# Patient Record
Sex: Male | Born: 2000 | Race: Black or African American | Hispanic: No | Marital: Single | Smoking: Never smoker
Health system: Southern US, Community
[De-identification: ages and names within clinical notes are randomized; demographics above are authoritative.]

---
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
## 2022-02-05 ENCOUNTER — Telehealth: Payer: Self-pay | Admitting: General Practice

## 2022-02-05 NOTE — Telephone Encounter (Signed)
Transition Care Management Follow-up Telephone Call Date of discharge and from where: 02/02/22 from [facility] How have you been since you were released from the hospital? His mom reports he is doing better; needs to be seen within 1-3 days. Any questions or concerns? No  Items Reviewed: Did the pt receive and understand the discharge instructions provided? Yes  Medications obtained and verified? Yes  Other? No  Any new allergies since your discharge? No  Dietary orders reviewed? Yes Do you have support at home? Yes   Home Care and Equipment/Supplies: Were home health services ordered? no  Functional Questionnaire: (I = Independent and D = Dependent) ADLs: I  Bathing/Dressing- I  Meal Prep- I  Eating- I  Maintaining continence- I  Transferring/Ambulation- I  Managing Meds- I  Follow up appointments reviewed:  PCP Hospital f/u appt confirmed? Yes  Scheduled to see Dr. Mel Almond on 02/07/22 @ 1400pm. [facility] f/u appt confirmed? No   Are transportation arrangements needed? No  If their condition worsens, is the pt aware to call PCP or go to the Emergency Dept.? Yes Was the patient provided with contact information for the PCP's office or ED? Yes Was to pt encouraged to call back with questions or concerns? Yes

## 2022-02-07 ENCOUNTER — Encounter: Payer: Self-pay | Admitting: Family Medicine

## 2022-02-07 ENCOUNTER — Ambulatory Visit (INDEPENDENT_AMBULATORY_CARE_PROVIDER_SITE_OTHER): Payer: Managed Care, Other (non HMO) | Admitting: Family Medicine

## 2022-02-07 VITALS — BP 115/70 | HR 75 | Ht 69.25 in | Wt 153.0 lb

## 2022-02-07 DIAGNOSIS — E876 Hypokalemia: Secondary | ICD-10-CM | POA: Diagnosis not present

## 2022-02-07 DIAGNOSIS — Z09 Encounter for follow-up examination after completed treatment for conditions other than malignant neoplasm: Secondary | ICD-10-CM

## 2022-02-07 NOTE — Assessment & Plan Note (Signed)
-   repeat CMP level to follow up on K  - encouraged K rich foods

## 2022-02-07 NOTE — Assessment & Plan Note (Signed)
-   believe syncopal episode is likely related to stress and lack of sleep  - encouraged sleeping and at least getting 6-8 hours of sleep on the weekends.

## 2022-02-07 NOTE — Progress Notes (Signed)
   Established Patient Office Visit  Subjective   Patient ID: Kristopher Green, male    DOB: May 28, 2000  Age: 21 y.o. MRN: [id number]  Chief Complaint  Patient presents with   Hospitalization Follow-up    HPI Pt presents for hospital follow up   He was recently seen at [facility] ED for a syncopal episode. Labs were significant for hypokalemia. All other labs were normal and pt was discharged home.   Today, pt is doing well. He says he felt fine during his ED visit. He is under a lot of stress. 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   Review of Systems  Constitutional:  Negative for chills and fever.  Respiratory:  Negative for cough and shortness of breath.   Cardiovascular:  Negative for chest pain.  Neurological:  Negative for headaches.      Objective:     BP 115/70   Pulse 75   Ht 5' 9.25" (1.759 m)   Wt 153 lb (69.4 kg)   SpO2 99%   BMI 22.43 kg/m    Physical Exam Vitals and nursing note reviewed.  Constitutional:      General: He is not in acute distress.    Appearance: Normal appearance.  HENT:     Head: Normocephalic and atraumatic.     Right Ear: External ear normal.     Left Ear: External ear normal.     Nose: Nose normal.  Eyes:     Conjunctiva/sclera: Conjunctivae normal.  Cardiovascular:     Rate and Rhythm: Normal rate and regular rhythm.  Pulmonary:     Effort: Pulmonary effort is normal.     Breath sounds: Normal breath sounds.  Neurological:     General: No focal deficit present.     Mental Status: He is alert and oriented to person, place, and time.  Psychiatric:        Mood and Affect: Mood normal.        Behavior: Behavior normal.        Thought Content: Thought content normal.        Judgment: Judgment normal.    No results found for any visits on 02/07/22.  The ASCVD Risk score (Arnett DK, et al., 2019) failed to calculate for the following  reasons:   The 2019 ASCVD risk score is only valid for ages 28 to 4    Assessment & Plan:   Problem List Items Addressed This Visit       Other   Hypokalemia    - repeat CMP level to follow up on K  - encouraged K rich foods      Relevant Orders   [facility] discharge follow-up - Primary    - believe syncopal episode is likely related to stress and lack of sleep  - encouraged sleeping and at least getting 6-8 hours of sleep on the weekends.       Return if symptoms worsen or fail to improve.    Owens Loffler, DO

## 2022-02-08 LAB — COMPLETE METABOLIC PANEL WITH GFR
AG Ratio: 1.6 (calc) (ref 1.0–2.5)
ALT: 6 U/L — ABNORMAL LOW (ref 8–46)
AST: 10 U/L — ABNORMAL LOW (ref 12–32)
Albumin: 4.9 g/dL (ref 3.6–5.1)
Alkaline phosphatase (APISO): 56 U/L (ref 46–169)
BUN: 11 mg/dL (ref 7–20)
CO2: 30 mmol/L (ref 20–32)
Calcium: 9.7 mg/dL (ref 8.9–10.4)
Chloride: 103 mmol/L (ref 98–110)
Creat: 1.05 mg/dL (ref 0.60–1.24)
Globulin: 3.1 g/dL (calc) (ref 2.1–3.5)
Glucose, Bld: 81 mg/dL (ref 65–99)
Potassium: 3.8 mmol/L (ref 3.8–5.1)
Sodium: 139 mmol/L (ref 135–146)
Total Bilirubin: 0.6 mg/dL (ref 0.2–1.1)
Total Protein: 8 g/dL (ref 6.3–8.2)
eGFR: 105 mL/min/{1.73_m2} (ref >=60)

## 2022-09-04 ENCOUNTER — Encounter: Payer: Self-pay | Admitting: *Deleted

## 2022-09-10 ENCOUNTER — Telehealth: Payer: Self-pay | Admitting: General Practice

## 2022-09-10 NOTE — Transitions of Care (Post Inpatient/ED Visit) (Signed)
   09/10/2022  Name: Kristopher Green MRN: [id number] DOB: 14-Sep-2000  Today's TOC FU Call Status: Today's TOC FU Call Status:: Unsuccessul Call (1st Attempt) Unsuccessful Call (1st Attempt) Date: 09/10/22  Attempted to reach the patient regarding the most recent Inpatient/ED visit.  Follow Up Plan: Additional outreach attempts will be made to reach the patient to complete the Transitions of Care (Post Inpatient/ED visit) call.   Signature Modesto Charon, RN BSN

## 2022-09-11 NOTE — Transitions of Care (Post Inpatient/ED Visit) (Signed)
   09/11/2022  Name: Kristopher Green MRN: [id number] DOB: February 12, 2001  Today's TOC FU Call Status: Today's TOC FU Call Status:: Unsuccessful Call (2nd Attempt) Unsuccessful Call (1st Attempt) Date: 09/10/22 Unsuccessful Call (2nd Attempt) Date: 09/11/22  Attempted to reach the patient regarding the most recent Inpatient/ED visit.  Follow Up Plan: Additional outreach attempts will be made to reach the patient to complete the Transitions of Care (Post Inpatient/ED visit) call.   Signature Modesto Charon, RN BSN

## 2022-09-12 NOTE — Transitions of Care (Post Inpatient/ED Visit) (Signed)
   09/12/2022  Name: Kristopher Green MRN: [id number] DOB: Apr 22, 2001  Today's TOC FU Call Status: Today's TOC FU Call Status:: Unsuccessful Call (3rd Attempt) Unsuccessful Call (1st Attempt) Date: 09/10/22 Unsuccessful Call (2nd Attempt) Date: 09/11/22 Unsuccessful Call (3rd Attempt) Date: 09/12/22  Attempted to reach the patient regarding the most recent Inpatient/ED visit.  Follow Up Plan: No further outreach attempts will be made at this time. We have been unable to contact the patient.  Signature Modesto Charon, RN BSN

## 2022-10-31 ENCOUNTER — Telehealth: Payer: Self-pay

## 2022-10-31 NOTE — Telephone Encounter (Signed)
LVM for patient to call back [phone], or to call PCP office to schedule follow up apt. AS, CMA  

## 2023-09-21 ENCOUNTER — Encounter (HOSPITAL_COMMUNITY): Payer: Self-pay

## 2023-09-21 ENCOUNTER — Emergency Department (HOSPITAL_COMMUNITY)
Admission: EM | Admit: 2023-09-21 | Discharge: 2023-09-22 | Disposition: A | Discharge status: Home / Self Care | Attending: Emergency Medicine | Admitting: Emergency Medicine

## 2023-09-21 ENCOUNTER — Other Ambulatory Visit: Payer: Self-pay

## 2023-09-21 ENCOUNTER — Emergency Department (HOSPITAL_COMMUNITY)

## 2023-09-21 DIAGNOSIS — J069 Acute upper respiratory infection, unspecified: Secondary | ICD-10-CM | POA: Insufficient documentation

## 2023-09-21 DIAGNOSIS — R059 Cough, unspecified: Secondary | ICD-10-CM | POA: Diagnosis present

## 2023-09-21 DIAGNOSIS — B9789 Other viral agents as the cause of diseases classified elsewhere: Secondary | ICD-10-CM | POA: Insufficient documentation

## 2023-09-21 LAB — CBC WITH DIFFERENTIAL/PLATELET
Abs Immature Granulocytes: 0.02 10*3/uL (ref 0.00–0.07)
Basophils Absolute: 0 10*3/uL (ref 0.0–0.1)
Basophils Relative: 0 %
Eosinophils Absolute: 0.2 10*3/uL (ref 0.0–0.5)
Eosinophils Relative: 1 %
HCT: 46.7 % (ref 39.0–52.0)
Hemoglobin: 15.8 g/dL (ref 13.0–17.0)
Immature Granulocytes: 0 %
Lymphocytes Relative: 16 %
Lymphs Abs: 1.9 10*3/uL (ref 0.7–4.0)
MCH: 29.9 pg (ref 26.0–34.0)
MCHC: 33.8 g/dL (ref 30.0–36.0)
MCV: 88.3 fL (ref 80.0–100.0)
Monocytes Absolute: 1.3 10*3/uL — ABNORMAL HIGH (ref 0.1–1.0)
Monocytes Relative: 11 %
Neutro Abs: 8.4 10*3/uL — ABNORMAL HIGH (ref 1.7–7.7)
Neutrophils Relative %: 72 %
Platelets: 177 10*3/uL (ref 150–400)
RBC: 5.29 MIL/uL (ref 4.22–5.81)
RDW: 13 % (ref 11.5–15.5)
WBC: 11.9 10*3/uL — ABNORMAL HIGH (ref 4.0–10.5)
nRBC: 0 % (ref 0.0–0.2)

## 2023-09-21 NOTE — ED Triage Notes (Signed)
 Productive cough with white thick secretions that started 3 days ago. Pt feels sob when he exerts himself that started yesterday. Pt states yesterday he felt a pop in his upper back yesterday

## 2023-09-21 NOTE — ED Notes (Signed)
 Patient transported to X-ray

## 2023-09-21 NOTE — ED Provider Notes (Signed)
 [place] EMERGENCY DEPARTMENT AT [facility] Provider Note   CSN: [id number] Arrival date & time: 09/21/23  2154     History {Add pertinent medical, surgical, social history, OB history to HPI:1} Chief Complaint  Patient presents with   Cough    Kristopher Green is a 23 y.o. male.  Patient here with difficulty breathing, cough for the past 3 days.  States his cough is restive of white mucus.  No fever.  Some right sided chest pain with coughing the last for several minutes to hours and resolves on its own.  No history of asthma.  Smokes marijuana but no cigarettes.  Pain is to his right chest that comes and goes lasting for several minutes to hours at a time.  Some shortness of breath.  Cough productive of white mucus with nasal congestion.  No sore throat.  No abdominal pain, vomiting, diarrhea or fever.  No pain with urination or blood in the urine.  Denies any history of asthma.  No cardiac or pulmonary history.  Has had sick contacts at home.  The history is provided by the patient.  Cough Associated symptoms: chest pain and rhinorrhea   Associated symptoms: no fever, no headaches, no myalgias and no rash        Home Medications Prior to Admission medications   Not on File      Allergies    Patient has no known allergies.    Review of Systems   Review of Systems  Constitutional:  Negative for activity change, appetite change and fever.  HENT:  Positive for congestion and rhinorrhea.   Respiratory:  Positive for cough and chest tightness.   Cardiovascular:  Positive for chest pain.  Gastrointestinal:  Negative for abdominal pain, nausea and vomiting.  Genitourinary:  Negative for dysuria and hematuria.  Musculoskeletal:  Negative for arthralgias and myalgias.  Skin:  Negative for rash.  Neurological:  Negative for dizziness, weakness and headaches.   all other systems are negative except as noted in the HPI and PMH.    Physical Exam Updated Vital  Signs BP 114/81   Pulse 94   Temp 98.4 F (36.9 C) (Oral)   Resp 18   Ht 5\' 9"  (1.753 m)   Wt 74.8 kg   SpO2 98%   BMI 24.37 kg/m  Physical Exam Vitals and nursing note reviewed.  Constitutional:      General: He is not in acute distress.    Appearance: He is well-developed.     Comments: No distress, speaking in full sentences  HENT:     Head: Normocephalic and atraumatic.     Mouth/Throat:     Pharynx: No oropharyngeal exudate.  Eyes:     Conjunctiva/sclera: Conjunctivae normal.     Pupils: Pupils are equal, round, and reactive to light.  Neck:     Comments: No meningismus. Cardiovascular:     Rate and Rhythm: Normal rate and regular rhythm.     Heart sounds: Normal heart sounds. No murmur heard. Pulmonary:     Effort: Pulmonary effort is normal. No respiratory distress.     Breath sounds: Normal breath sounds.  Chest:     Chest wall: Tenderness present.  Abdominal:     Palpations: Abdomen is soft.     Tenderness: There is no abdominal tenderness. There is no guarding or rebound.  Musculoskeletal:        General: No tenderness. Normal range of motion.     Cervical back: Normal range of  motion and neck supple.  Skin:    General: Skin is warm.  Neurological:     Mental Status: He is alert and oriented to person, place, and time.     Cranial Nerves: No cranial nerve deficit.     Motor: No abnormal muscle tone.     Coordination: Coordination normal.     Comments:  5/5 strength throughout. CN 2-12 intact.Equal grip strength.   Psychiatric:        Behavior: Behavior normal.     ED Results / Procedures / Treatments   Labs (all labs ordered are listed, but only abnormal results are displayed) Labs Reviewed  RESP PANEL BY RT-PCR (RSV, FLU A&B, COVID)  [id number]  CBC WITH DIFFERENTIAL/PLATELET  COMPREHENSIVE METABOLIC PANEL WITH GFR  D-DIMER, QUANTITATIVE  TROPONIN I (HIGH SENSITIVITY)    EKG EKG Interpretation Date/Time:  Saturday Sep 21 2023 22:07:19  EDT Ventricular Rate:  101 PR Interval:  172 QRS Duration:  85 QT Interval:  296 QTC Calculation: 384 R Axis:   93  Text Interpretation: Sinus tachycardia Right atrial enlargement Borderline right axis deviation ST elev, probable normal early repol pattern No previous ECGs available Confirmed by Earma Gloss [phone]) on 09/21/2023 11:04:28 PM  Radiology No results found.  Procedures Procedures  {Document cardiac monitor, telemetry assessment procedure when appropriate:1}  Medications Ordered in ED Medications - No data to display  ED Course/ Medical Decision Making/ A&P   {   Click here for ABCD2, HEART and other calculatorsREFRESH Note before signing :1}                              Medical Decision Making Amount and/or Complexity of Data Reviewed Labs: ordered. Decision-making details documented in ED Course. Radiology: ordered and independent interpretation performed. Decision-making details documented in ED Course. ECG/medicine tests: ordered and independent interpretation performed. Decision-making details documented in ED Course.   3 days of URI symptoms with cough, shortness of breath, chest pain with coughing.  No hypoxia or increased work of breathing.  EKG is nonischemic.  Lungs are clear without wheezing or rhonchi.  Will check labs and chest x-ray and COVID swab Low suspicion for ACS or PE  {Document critical care time when appropriate:1} {Document review of labs and clinical decision tools ie heart score, Chads2Vasc2 etc:1}  {Document your independent review of radiology images, and any outside records:1} {Document your discussion with family members, caretakers, and with consultants:1} {Document social determinants of health affecting pt's care:1} {Document your decision making why or why not admission, treatments were needed:1} Final Clinical Impression(s) / ED Diagnoses Final diagnoses:  None    Rx / DC Orders ED Discharge Orders     None

## 2023-09-22 LAB — COMPREHENSIVE METABOLIC PANEL WITH GFR
ALT: 14 U/L (ref 0–44)
AST: 16 U/L (ref 15–41)
Albumin: 4.5 g/dL (ref 3.5–5.0)
Alkaline Phosphatase: 52 U/L (ref 38–126)
Anion gap: 10 (ref 5–15)
BUN: 15 mg/dL (ref 6–20)
CO2: 27 mmol/L (ref 22–32)
Calcium: 9.9 mg/dL (ref 8.9–10.3)
Chloride: 103 mmol/L (ref 98–111)
Creatinine, Ser: 1.12 mg/dL (ref 0.61–1.24)
GFR, Estimated: >60 mL/min (ref >60)
Glucose, Bld: 81 mg/dL (ref 70–99)
Potassium: 4.2 mmol/L (ref 3.5–5.1)
Sodium: 140 mmol/L (ref 135–145)
Total Bilirubin: 1.3 mg/dL — ABNORMAL HIGH (ref 0.0–1.2)
Total Protein: 8 g/dL (ref 6.5–8.1)

## 2023-09-22 LAB — RESP PANEL BY RT-PCR (RSV, FLU A&B, COVID)  RVPGX2
Influenza A by PCR: NEGATIVE
Influenza B by PCR: NEGATIVE
Resp Syncytial Virus by PCR: NEGATIVE
SARS Coronavirus 2 by RT PCR: NEGATIVE

## 2023-09-22 LAB — TROPONIN I (HIGH SENSITIVITY): Troponin I (High Sensitivity): <2 ng/L (ref <18)

## 2023-09-22 LAB — D-DIMER, QUANTITATIVE: D-Dimer, Quant: <0.27 ug{FEU}/mL (ref 0.00–0.50)

## 2023-09-22 MED ORDER — ALBUTEROL SULFATE HFA 108 (90 BASE) MCG/ACT IN AERS: 1.0000 | INHALATION_SPRAY | Freq: Four times a day (QID) | RESPIRATORY_TRACT | 0 refills | Status: AC | PRN

## 2023-09-22 MED ORDER — DOXYCYCLINE HYCLATE 100 MG PO CAPS: 100.0000 mg | ORAL_CAPSULE | Freq: Two times a day (BID) | ORAL | 0 refills | Status: DC

## 2023-09-22 NOTE — Discharge Instructions (Signed)
 Testing is reassuring.  No evidence of pneumonia.  Take the antibiotic as prescribed for possible bronchitis.  Keep yourself hydrated.  Use Tylenol  or Motrin as needed for aches and for fevers.  Return to the ED with chest pain, exertional shortness of breath or other concerns

## 2024-04-12 ENCOUNTER — Encounter: Payer: Self-pay | Admitting: Emergency Medicine

## 2024-04-12 ENCOUNTER — Ambulatory Visit
Admission: EM | Admit: 2024-04-12 | Discharge: 2024-04-12 | Disposition: A | Discharge status: Home / Self Care | Payer: Worker's Compensation | Attending: Family Medicine | Admitting: Family Medicine

## 2024-04-12 DIAGNOSIS — M545 Low back pain, unspecified: Secondary | ICD-10-CM

## 2024-04-12 NOTE — Discharge Instructions (Signed)
 May take ibuprofen 600 mg if needed for back pain Try to limit twisting Report to work tomorrow.  If you are unable to perform your duties I will send you for Worker's Comp. evaluation

## 2024-04-12 NOTE — ED Triage Notes (Signed)
 Patient presents to Urgent Care with complaints of lower back injury at work since 11:30 am yesterday. Patient reports moving a pallet at work and twisted his back and felt a pull/tension of the lower left back. Sitting is no pain, standing and twisting does cause pain. Applied heating pad to the area. No pain medications needed

## 2024-04-12 NOTE — ED Provider Notes (Signed)
 [facility]    CSN: [id number] Arrival date & time: 04/12/24  1211      History   Chief Complaint No chief complaint on file.   HPI Kristopher Green is a 23 y.o. male.   HPI  Patient states he pulled a muscle in his back at work day before yesterday.  He states he was bent over and was pulling on a jack that was stuck and as he twisted he felt some pain.  He did report this.  He was unable to finish his shift.  He has been home recovering today.  He would like a note to go back to work tomorrow.  He has not taken any Advil or pain medication.  Pain only is present with certain twisting movements.  He slept well last night.  No radiation of pain.  No ongoing back condition History reviewed. No pertinent past medical history.  Patient Active Problem List   Diagnosis Date Noted   Hypokalemia 02/07/2022   Hospital discharge follow-up 02/07/2022   Sprain of anterior talofibular ligament of left ankle 02/11/2020   Eczema 08/09/2014   Cardiac murmur, unspecified 04/01/2014   Carotid bruit 04/01/2014   Other specified symptoms and signs involving the circulatory and respiratory systems 04/01/2014    History reviewed. No pertinent surgical history.     Home Medications    Prior to Admission medications   Medication Sig Start Date End Date Taking? Authorizing Provider  albuterol  (VENTOLIN  HFA) 108 (90 Base) MCG/ACT inhaler Inhale 1-2 puffs into the lungs every 6 (six) hours as needed for wheezing or shortness of breath. 09/22/23   Carita Senior, MD    Family History History reviewed. No pertinent family history.  Social History Social History   Tobacco Use   Smoking status: Never   Smokeless tobacco: Never  Substance Use Topics   Alcohol use: Never   Drug use: Never     Allergies   Patient has no known allergies.   Review of Systems Review of Systems See HPI  Physical Exam Triage Vital Signs ED Triage Vitals  Encounter Vitals Group     BP  04/12/24 1228 112/77     Girls Systolic BP Percentile --      Girls Diastolic BP Percentile --      Boys Systolic BP Percentile --      Boys Diastolic BP Percentile --      Pulse Rate 04/12/24 1228 (!) 108     Resp 04/12/24 1228 16     Temp 04/12/24 1228 97.9 F (36.6 C)     Temp Source 04/12/24 1228 Oral     SpO2 04/12/24 1228 98 %     Weight --      Height --      Head Circumference --      Peak Flow --      Pain Score 04/12/24 1226 4     Pain Loc --      Pain Education --      Exclude from Growth Chart --    No data found.  Updated Vital Signs BP 112/77 (BP Location: Right Arm)   Pulse (!) 108   Temp 97.9 F (36.6 C) (Oral)   Resp 16   SpO2 98%       Physical Exam Constitutional:      General: He is not in acute distress.    Appearance: He is well-developed.  HENT:     Head: Normocephalic and atraumatic.  Eyes:  Conjunctiva/sclera: Conjunctivae normal.     Pupils: Pupils are equal, round, and reactive to light.  Cardiovascular:     Rate and Rhythm: Normal rate.  Pulmonary:     Effort: Pulmonary effort is normal. No respiratory distress.  Musculoskeletal:        General: Normal range of motion.     Cervical back: Normal range of motion.     Comments: Very mild tenderness to palpation of the left SI joint.  Strength, sensation, range of motion, and reflexes are normal in both lower extremities.  Straight leg raise is negative bilaterally.  Range of motion is full  Skin:    General: Skin is warm and dry.  Neurological:     General: No focal deficit present.     Mental Status: He is alert.      UC Treatments / Results  Labs (all labs ordered are listed, but only abnormal results are displayed) Labs Reviewed - No data to display  EKG   Radiology No results found.  Procedures Procedures (including critical care time)  Medications Ordered in UC Medications - No data to display  Initial Impression / Assessment and Plan / UC Course  I have  reviewed the triage vital signs and the nursing notes.  Pertinent labs & imaging results that were available during my care of the patient were reviewed by me and considered in my medical decision making (see chart for details).     Final Clinical Impressions(s) / UC Diagnoses   Final diagnoses:  Acute midline low back pain without sciatica     Discharge Instructions      May take ibuprofen 600 mg if needed for back pain Try to limit twisting Report to work tomorrow.  If you are unable to perform your duties I will send you for Worker's Comp. evaluation     ED Prescriptions   None    PDMP not reviewed this encounter.   Maranda Jamee Jacob, MD 04/12/24 [phone]
